# Patient Record
Sex: Female | Born: 1995 | Race: White | Hispanic: No | Marital: Single | Smoking: Never smoker
Health system: Southern US, Community
[De-identification: ages and names within clinical notes are randomized; demographics above are authoritative.]

## PROBLEM LIST (undated history)

## (undated) DIAGNOSIS — Z8719 Personal history of other diseases of the digestive system: Secondary | ICD-10-CM

## (undated) HISTORY — DX: Personal history of other diseases of the digestive system: Z87.19

---
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
## 2016-09-24 ENCOUNTER — Emergency Department
Admission: EM | Admit: 2016-09-24 | Discharge: 2016-09-24 | Disposition: A | Discharge status: Home / Self Care | Payer: BLUE CROSS/BLUE SHIELD | Attending: Student | Admitting: Student

## 2016-09-24 ENCOUNTER — Encounter: Payer: Self-pay | Admitting: Emergency Medicine

## 2016-09-24 DIAGNOSIS — W052XXA Fall from non-moving motorized mobility scooter, initial encounter: Secondary | ICD-10-CM | POA: Insufficient documentation

## 2016-09-24 DIAGNOSIS — IMO0002 Reserved for concepts with insufficient information to code with codable children: Secondary | ICD-10-CM

## 2016-09-24 DIAGNOSIS — Z79899 Other long term (current) drug therapy: Secondary | ICD-10-CM | POA: Insufficient documentation

## 2016-09-24 DIAGNOSIS — S0181XA Laceration without foreign body of other part of head, initial encounter: Secondary | ICD-10-CM | POA: Diagnosis not present

## 2016-09-24 DIAGNOSIS — Y999 Unspecified external cause status: Secondary | ICD-10-CM | POA: Insufficient documentation

## 2016-09-24 DIAGNOSIS — Y9389 Activity, other specified: Secondary | ICD-10-CM | POA: Insufficient documentation

## 2016-09-24 DIAGNOSIS — Y929 Unspecified place or not applicable: Secondary | ICD-10-CM | POA: Diagnosis not present

## 2016-09-24 MED ORDER — LIDOCAINE HCL (PF) 1 % IJ SOLN
INTRAMUSCULAR | Status: AC
  Administered 2016-09-24: 08:00:00
  Filled 2016-09-24: qty 5
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
## 2016-09-24 NOTE — Discharge Instructions (Signed)
Have stitches removed in 7 days. You may return here, go to student health, or go to the [facility].
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
## 2018-04-26 ENCOUNTER — Ambulatory Visit: Payer: BLUE CROSS/BLUE SHIELD | Admitting: Obstetrics and Gynecology

## 2018-04-26 ENCOUNTER — Encounter: Payer: Self-pay | Admitting: Obstetrics and Gynecology

## 2018-04-26 VITALS — BP 108/64 | HR 83 | Ht 64.0 in | Wt 137.0 lb

## 2018-04-26 DIAGNOSIS — N939 Abnormal uterine and vaginal bleeding, unspecified: Secondary | ICD-10-CM

## 2018-04-26 NOTE — Progress Notes (Addendum)
Gynecology Abnormal Uterine Bleeding Initial Evaluation   Chief Complaint:  Chief Complaint  Patient presents with  . Irregular cycles    heavy for few days and then tapers off    History of Present Illness:    Paitient is a 22 y.o. G0P0000 who LMP was Patient's last menstrual period was 04/25/2018 (exact date)., presents today for a problem visit.  She complains of abnormal uterine bleeding that has been present for great than 1 year and its severity is described as moderate.  She has irregular periods every 2-3 weeks and they are associated with mild menstrual cramping.  She has used the following for attempts at control: tampon. The patient is sexually active. She currently uses condomsfor contraception, has used morning after pill as well.   Previous evaluation: none Prior Diagnosis: N/A. Previous Treatment: was written an Rx for LoLoestrin FE in [place] but did not start.  LMP: Patient's last menstrual period was 04/25/2018 (exact date). Shortest Interval: 2 weeks Longest Interval: 3 weeks Duration of flow: 5 days Heavy Menses: yes first two days Intermenstrual Bleeding: no Postcoital Bleeding: no Dysmenorrhea: no  PCOS: denies weight changes in past year, no hirsutism, acne, or periods of amenorrhea  Thyroid: no temperature intolerance, no constipation, no diarrhea, no palpitations, no skin or hair changes  Fragile X: no family history of fragile X or autism, or premature menopause  Prolactin: no headaches, visions changes, or nipple discharge  Review of Systems: Review of Systems  All other systems reviewed and are negative.   Past Medical History:  Past Medical History:  Diagnosis Date  . History of rectal bleeding 2016    Past Surgical History:  Past Surgical History:  Procedure Laterality Date  . COLONOSCOPY  2016    Obstetric History: G0P0000  Family History:  Family History  Problem Relation Age of Onset  . Breast cancer Paternal Grandmother      Social History:  Social History   Socioeconomic History  . Marital status: Single    Spouse name: Not on file  . Number of children: Not on file  . Years of education: Not on file  . Highest education level: Not on file  Occupational History  . Not on file  Social Needs  . Financial resource strain: Not on file  . Food insecurity:    Worry: Not on file    Inability: Not on file  . Transportation needs:    Medical: Not on file    Non-medical: Not on file  Tobacco Use  . Smoking status: Never Smoker  . Smokeless tobacco: Never Used  Substance and Sexual Activity  . Alcohol use: Yes    Comment: 4 x a week  . Drug use: Yes    Types: Cocaine  . Sexual activity: Yes    Partners: Male    Birth control/protection: None  Lifestyle  . Physical activity:    Days per week: 0 days    Minutes per session: 0 min  . Stress: Very much  Relationships  . Social connections:    Talks on phone: Not on file    Gets together: Not on file    Attends religious service: Not on file    Active member of club or organization: Not on file    Attends meetings of clubs or organizations: Not on file    Relationship status: Not on file  . Intimate partner violence:    Fear of current or ex partner: Not on file  Emotionally abused: Not on file    Physically abused: Not on file    Forced sexual activity: Not on file  Other Topics Concern  . 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  Patient's last menstrual period was 04/25/2018 (exact date).  General: NAD HEENT: normocephalic, anicteric Pulmonary: No increased work of breathing Genitourinary:  External: Normal external female genitalia.  Normal  urethral meatus, normal Bartholin's and Skene's glands.    Vagina: Normal vaginal mucosa, no evidence of prolapse.    Cervix: Grossly normal in appearance, mild bleeding present  Uterus: Non-enlarged, mobile, normal contour.  No CMT  Adnexa: ovaries non-enlarged, no adnexal masses  Rectal: deferred  Lymphatic: no evidence of inguinal lymphadenopathy Extremities: no edema, erythema, or tenderness Neurologic: Grossly intact Psychiatric: mood appropriate, affect full  Female chaperone present for pelvic portions of the physical exam  Assessment: 22 y.o. G0P0000 with abnormal uterine bleeding  Plan: Problem List Items Addressed This Visit    None    Visit Diagnoses    Abnormal uterine bleeding    -  Primary   Relevant Orders   TSH+Prl+FSH+TestT+LH+DHEA S...   US Transvaginal Non-OB      1) Discussed management options for abnormal uterine bleeding including expectant, NSAIDs, tranexamic acid (Lysteda), oral progesterone (Provera, norethindrone, megace), Depo Provera, Levonorgestrel containing IUD, endometrial ablation (Novasure) or hysterectomy as definitive surgical management.  Discussed risks and benefits of each method.   Final management decision will hinge on results of patient's work up and whether an underlying etiology for the patients bleeding symptoms can be discerned.  We will conduct a basic work up examining using the PALM-COIEN classification system.  In the meantime the patient opts to trial expectant management while we await results of her ultrasound and labs.  The patient has some concern that this may affect future fertility - will obtain day 3 FSH/Estradiol with next cycle for ovarian reserve testing  2) STI screening - offered and declined  3) Gardasil series - discussed and given handouts  4)  Return in about 1 week (around 05/03/2018) for TVUS and gyn follow up.   Vena Austria, MD, [facility], [facility] 04/26/2018, 9:27  AM

## 2018-04-26 NOTE — Patient Instructions (Signed)
[email or web address] 

## 2018-04-29 LAB — TSH+PRL+FSH+TESTT+LH+DHEA S...
17 HYDROXYPROGESTERONE: 58 ng/dL
Androstenedione: 273 ng/dL — ABNORMAL HIGH (ref 41–262)
DHEA-SO4: 610.2 ug/dL — ABNORMAL HIGH (ref 110.0–431.7)
FSH: 5 m[IU]/mL
LH: 9.4 m[IU]/mL
PROLACTIN: 27.9 ng/mL — AB (ref 4.8–23.3)
TESTOSTERONE FREE: 10 pg/mL — AB (ref 0.0–4.2)
TESTOSTERONE: 108 ng/dL — AB (ref 8–48)
TSH: 1.6 u[IU]/mL (ref 0.450–4.500)

## 2018-05-01 ENCOUNTER — Encounter (INDEPENDENT_AMBULATORY_CARE_PROVIDER_SITE_OTHER): Payer: Self-pay

## 2018-05-01 ENCOUNTER — Telehealth: Payer: Self-pay | Admitting: Obstetrics and Gynecology

## 2018-05-01 NOTE — Telephone Encounter (Signed)
Patient is calling for labs results. Please advise. 

## 2018-05-02 ENCOUNTER — Other Ambulatory Visit: Payer: Self-pay | Admitting: Obstetrics and Gynecology

## 2018-05-02 ENCOUNTER — Encounter (INDEPENDENT_AMBULATORY_CARE_PROVIDER_SITE_OTHER): Payer: Self-pay

## 2018-05-02 DIAGNOSIS — R7989 Other specified abnormal findings of blood chemistry: Secondary | ICD-10-CM

## 2018-05-02 NOTE — Telephone Encounter (Signed)
Patient is following up on test results. Please advise

## 2018-05-03 ENCOUNTER — Telehealth: Payer: Self-pay | Admitting: Obstetrics and Gynecology

## 2018-05-03 NOTE — Telephone Encounter (Signed)
Patient returned the call and is aware of appointment, location, and instructions. Patient was given the address and directions to [facility], Patient was given directions to [facility] to pick up the prep kit. Patient is aware of water only after 6am on day of test, and that she will receive instructions with the prep kit and to look over those and ask any questions at that time. Patient will ask if okay for alcohol on Thursday night.

## 2018-05-03 NOTE — Telephone Encounter (Signed)
Patient is scheduled at first available appointment at [facility] on Friday, 05/05/18 @ 10am. Patient should arrive @ 9:45am and can have water only after 6am. Patient should pick up a prep kit by Thursday, 8am-4pm, either facility. Lmtrc.

## 2018-05-04 ENCOUNTER — Other Ambulatory Visit: Payer: Self-pay | Admitting: Obstetrics and Gynecology

## 2018-05-04 DIAGNOSIS — E281 Androgen excess: Secondary | ICD-10-CM

## 2018-05-05 ENCOUNTER — Ambulatory Visit
Admission: RE | Admit: 2018-05-05 | Discharge: 2018-05-05 | Disposition: A | Discharge status: Home / Self Care | Payer: BLUE CROSS/BLUE SHIELD | Source: Ambulatory Visit | Attending: Obstetrics and Gynecology | Admitting: Obstetrics and Gynecology

## 2018-05-05 ENCOUNTER — Encounter (INDEPENDENT_AMBULATORY_CARE_PROVIDER_SITE_OTHER): Payer: Self-pay

## 2018-05-05 DIAGNOSIS — E281 Androgen excess: Secondary | ICD-10-CM | POA: Insufficient documentation

## 2018-05-10 ENCOUNTER — Ambulatory Visit (INDEPENDENT_AMBULATORY_CARE_PROVIDER_SITE_OTHER): Payer: BLUE CROSS/BLUE SHIELD

## 2018-05-10 ENCOUNTER — Encounter: Payer: Self-pay | Admitting: Obstetrics and Gynecology

## 2018-05-10 ENCOUNTER — Ambulatory Visit (INDEPENDENT_AMBULATORY_CARE_PROVIDER_SITE_OTHER): Payer: BLUE CROSS/BLUE SHIELD | Admitting: Obstetrics and Gynecology

## 2018-05-10 VITALS — BP 112/58 | HR 96 | Ht 64.0 in | Wt 136.0 lb

## 2018-05-10 DIAGNOSIS — N939 Abnormal uterine and vaginal bleeding, unspecified: Secondary | ICD-10-CM

## 2018-05-10 DIAGNOSIS — E281 Androgen excess: Secondary | ICD-10-CM

## 2018-05-11 ENCOUNTER — Encounter: Payer: Self-pay | Admitting: Obstetrics and Gynecology

## 2018-05-11 NOTE — Progress Notes (Signed)
Gynecology Ultrasound Follow Up  Chief Complaint:  Chief Complaint  Patient presents with  . U/S follow up     History of Present Illness: Patient is a 22 y.o. female who presents today for ultrasound evaluation of AUB and excess androgens.  Ultrasound demonstrates the following findgins Adnexa: Normal consistency bilaterally although the right ovarian volume is increased at 15cm^3 Uterus: unremarkable with thin endometrials stripe no myometrial defects Additional: none  Review of Systems: Review of Systems  Constitutional: Negative.   Gastrointestinal: Negative.   Skin: Negative.     Past Medical History:  Past Medical History:  Diagnosis Date  . History of rectal bleeding 2016    Past Surgical History:  Past Surgical History:  Procedure Laterality Date  . COLONOSCOPY  2016    Gynecologic History:  Patient's last menstrual period was 04/25/2018 (exact date). Contraception: none Last Pap: N/A <21  Family History:  Family History  Problem Relation Age of Onset  . Breast cancer Paternal Grandmother     Social History:  Social History   Socioeconomic History  . Marital status: Single    Spouse name: Not on file  . Number of children: Not on file  . Years of education: Not on file  . Highest education level: Not on file  Occupational History  . Not on file  Social Needs  . Financial resource strain: Not on file  . Food insecurity:    Worry: Not on file    Inability: Not on file  . Transportation needs:    Medical: Not on file    Non-medical: Not on file  Tobacco Use  . Smoking status: Never Smoker  . Smokeless tobacco: Never Used  Substance and Sexual Activity  . Alcohol use: Yes    Comment: 4 x a week  . Drug use: Yes    Types: Cocaine  . Sexual activity: Yes    Partners: Male    Birth control/protection: None  Lifestyle  . Physical activity:    Days per week: 0 days    Minutes per session: 0 min  . Stress: Very much  Relationships  .  Social connections:    Talks on phone: Not on file    Gets together: Not on file    Attends religious service: Not on file    Active member of club or organization: Not on file    Attends meetings of clubs or organizations: Not on file    Relationship status: Not on file  . Intimate partner violence:    Fear of current or ex partner: Not on file    Emotionally abused: Not on file    Physically abused: Not on file    Forced sexual activity: Not on file  Other Topics Concern  . 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  General: NAD HEENT: normocephalic, anicteric Pulmonary: No increased work of breathing Extremities: no edema, erythema, or tenderness Neurologic: Grossly intact, normal gait Psychiatric: mood appropriate, affect full  Ct Abdomen Wo Contrast  Result Date: 05/05/2018 CLINICAL DATA:  Elevated androgen levels. EXAM: CT ABDOMEN WITHOUT CONTRAST TECHNIQUE: Multidetector CT imaging of the abdomen was performed following the standard protocol without IV  contrast. COMPARISON:  None. FINDINGS: Lower chest: No acute abnormality. Hepatobiliary: No focal liver abnormality is seen. No gallstones, gallbladder wall thickening, or biliary dilatation. Pancreas: Unremarkable. No pancreatic ductal dilatation or surrounding inflammatory changes. Spleen: Normal in size without focal abnormality. Adrenals/Urinary Tract: Adrenal glands and kidneys appear normal. No hydronephrosis or renal obstruction is noted. No renal calculi are noted. Stomach/Bowel: Stomach and visualized bowel loops are unremarkable. Vascular/Lymphatic: No significant vascular abnormality is noted. No adenopathy is noted. Other: No  abdominal wall hernia or abnormality. Musculoskeletal: No acute or significant osseous findings. IMPRESSION: No abnormality seen in the abdomen.  Adrenal glands appear normal. Electronically Signed   By: Lupita Raider, M.D.   On: 05/05/2018 14:50   US Transvaginal Non-ob  Result Date: 05/10/2018 ULTRASOUND REPORT Patient Name: Briana Pierce DOB: Jan 14, 1996 MRN: [id number] Location: [facility] Date of Service: 05/10/2018 Indications:AUB Findings: The uterus is anteverted and measures 7.96 x 4.44 x 3.48cm. Echo texture is homogenous without evidence of focal masses. The Endometrium measures 6.33 mm. Right Ovary measures 4.03 x 3.43 x 2.20 cm, 15.9cc's. It is normal in appearance. Left Ovary measures 2.68 x 2.03 x 2.09 cm, 5.9cc's. It is normal in appearance. Survey of the adnexa demonstrates no adnexal masses. There is no free fluid in the cul de sac. Impression: 1. Normal gyn ultrasound Recommendations: 1.Clinical correlation with the patient's History and Physical Exam. Willette Alma, RDMS, RVT Images reviewed.  Normal GYN study without visualized pathology.  Vena Austria, MD, [facility], [facility] 05/10/2018, 2:35 PM     Assessment: 21 y.o. G0P0000 No problem-specific Assessment & Plan notes found for this encounter.   Plan: Problem List Items Addressed This Visit    None    Visit Diagnoses    Elevated androgen levels    -  Primary   Relevant Orders   AFP tumor marker   Inhibin B   Testosterone,Free and Total   DHEA-sulfate   Androstenedione      1) Ultrasound evaluation - the right ovary is relatively enlarged in overall volume compared to the left, there is NO distinct mass visible.  Discussed with patient that given normal adrenal glands on CT, I had been most concerned about a Sertoli-Leydig cell tumor of the ovary.  These represent as large ovarian masses.  Given the elevated androgen levels were found incidentally finding of andorgen elevation  during work up for irregular cycles tumor markers were obtained today to rule out an early or small sex cord stromal tumor.  I will discuss the case with gynecology oncology to get there input on any further work up or observation.  2) A total of 15 minutes were spent in face-to-face contact with the patient during this encounter with over half of that time devoted to counseling and coordination of care.    Vena Austria, MD, [facility], [facility] 05/11/2018, 2:37 PM

## 2018-05-15 ENCOUNTER — Telehealth: Payer: Self-pay

## 2018-05-15 NOTE — Telephone Encounter (Signed)
The patient has no one her Hippa form. Please call patient with results

## 2018-05-15 NOTE — Telephone Encounter (Signed)
Pt mom calling for results for daughter, states if daughter needs to call or give permission for her to have results than she will. Please call one of them with results

## 2018-05-16 ENCOUNTER — Encounter: Payer: Self-pay | Admitting: Obstetrics and Gynecology

## 2018-05-16 NOTE — Telephone Encounter (Signed)
Not back yet I will be calling her once they are back

## 2018-05-17 LAB — AFP TUMOR MARKER: AFP, SERUM, TUMOR MARKER: 3.1 ng/mL (ref 0.0–8.3)

## 2018-05-17 LAB — ANDROSTENEDIONE: Androstenedione: 224 ng/dL (ref 41–262)

## 2018-05-17 LAB — DHEA-SULFATE: DHEA-SO4: 659.6 ug/dL — ABNORMAL HIGH (ref 110.0–431.7)

## 2018-05-17 LAB — INHIBIN B: Inhibin B: 251.1 pg/mL

## 2018-05-17 LAB — TESTOSTERONE,FREE AND TOTAL
TESTOSTERONE FREE: 8.7 pg/mL — AB (ref 0.0–4.2)
Testosterone: 83 ng/dL — ABNORMAL HIGH (ref 8–48)

## 2018-05-18 ENCOUNTER — Encounter (INDEPENDENT_AMBULATORY_CARE_PROVIDER_SITE_OTHER): Payer: Self-pay

## 2018-05-18 ENCOUNTER — Other Ambulatory Visit: Payer: Self-pay | Admitting: Obstetrics and Gynecology

## 2018-06-20 ENCOUNTER — Encounter: Payer: Self-pay | Admitting: Obstetrics and Gynecology

## 2018-06-20 ENCOUNTER — Other Ambulatory Visit: Payer: Self-pay | Admitting: Obstetrics and Gynecology

## 2018-06-20 DIAGNOSIS — E282 Polycystic ovarian syndrome: Secondary | ICD-10-CM

## 2018-06-20 DIAGNOSIS — E281 Androgen excess: Secondary | ICD-10-CM

## 2018-07-05 ENCOUNTER — Encounter: Payer: Self-pay | Admitting: Obstetrics and Gynecology

## 2018-07-13 ENCOUNTER — Telehealth: Payer: Self-pay

## 2018-07-13 NOTE — Telephone Encounter (Addendum)
Linda w/[facility] in [place] reports they received an encounter summary from us that lists all the labs pt had but are listed in the enounter summary. Their NP is having trouble reading them that way & requests we send them the labs as they appear in the original [facility] [employer] [employer] summary. Fax [phone]. Cb#[phone] Ext [phone]. Harvel QualeJanet Budzynski, N.P. requesting.

## 2018-07-13 NOTE — Telephone Encounter (Signed)
Lab Results received from [facility] Tech in office & Faxed as requested.

## 2018-09-06 ENCOUNTER — Encounter: Payer: Self-pay | Admitting: Obstetrics and Gynecology

## 2018-09-06 ENCOUNTER — Ambulatory Visit (INDEPENDENT_AMBULATORY_CARE_PROVIDER_SITE_OTHER): Payer: BLUE CROSS/BLUE SHIELD | Admitting: Obstetrics and Gynecology

## 2018-09-06 VITALS — BP 114/74 | HR 97 | Ht 64.0 in | Wt 139.0 lb

## 2018-09-06 DIAGNOSIS — E282 Polycystic ovarian syndrome: Secondary | ICD-10-CM | POA: Diagnosis not present

## 2018-09-06 DIAGNOSIS — L680 Hirsutism: Secondary | ICD-10-CM | POA: Diagnosis not present

## 2018-09-06 DIAGNOSIS — L709 Acne, unspecified: Secondary | ICD-10-CM | POA: Diagnosis not present

## 2018-09-06 MED ORDER — NORETHIN ACE-ETH ESTRAD-FE 1-20 MG-MCG(24) PO CAPS: 1.0000 | ORAL_CAPSULE | Freq: Every day | ORAL | 0 refills | Status: DC

## 2018-09-06 MED ORDER — SPIRONOLACTONE 100 MG PO TABS: 100.0000 mg | ORAL_TABLET | Freq: Every day | ORAL | 11 refills | Status: DC

## 2018-09-06 NOTE — Progress Notes (Addendum)
Obstetrics & Gynecology Office Visit   Chief Complaint:  Chief Complaint  Patient presents with  . Follow-up    PCOS/ Irregular cycles    History of Present Illness: 22 y.o. G0P0000 presenting for medication follow up and discussion of additional management option for a diagnosis of PCOS.  She is currently being managed with lo loestrin FE.   The patient reports no improvement in symptoms.  On her current medication regimen has intermittent breakthrough bleeding.   She has not noted any side-effects or new symptoms.  She discontinued lo loestrin secondary to failure to achieve withdrawal bleeds consistently.  She also is interested in additional options to manage hirsutism and acne.  She is currently awaiting consultation with a REI.  Is interested in repeat ultrasound to evaluate ovaries again.  She also expresses concern about future fertility and whether she should undergo oocyte freezing.  We discussed that this isn't indicated, that some patient achieve conception with out additional measures despite PCOS, and that response to oral ovulation agents such as letrozole is generally good.  Review of Systems: Review of Systems  Constitutional: Negative.   Gastrointestinal: Negative.   Genitourinary: Negative.    Past Medical History:  Past Medical History:  Diagnosis Date  . History of rectal bleeding 2016    Past Surgical History:  Past Surgical History:  Procedure Laterality Date  . COLONOSCOPY  2016    Gynecologic History: Patient's last menstrual period was 08/26/2018 (exact date).  Obstetric History: G0P0000  Family History:  Family History  Problem Relation Age of Onset  . Breast cancer Paternal Grandmother     Social History:  Social History   Socioeconomic History  . Marital status: Single    Spouse name: Not on file  . Number of children: Not on file  . Years of education: Not on file  . Highest education level: Not on file  Occupational History  .  Not on file  Social Needs  . Financial resource strain: Not on file  . Food insecurity:    Worry: Not on file    Inability: Not on file  . Transportation needs:    Medical: Not on file    Non-medical: Not on file  Tobacco Use  . Smoking status: Never Smoker  . Smokeless tobacco: Never Used  Substance and Sexual Activity  . Alcohol use: Yes    Comment: 4 x a week  . Drug use: Yes    Types: Cocaine  . Sexual activity: Yes    Partners: Male    Birth control/protection: None  Lifestyle  . Physical activity:    Days per week: 0 days    Minutes per session: 0 min  . Stress: Very much  Relationships  . Social connections:    Talks on phone: Not on file    Gets together: Not on file    Attends religious service: Not on file    Active member of club or organization: Not on file    Attends meetings of clubs or organizations: Not on file    Relationship status: Not on file  . Intimate partner violence:    Fear of current or ex partner: Not on file    Emotionally abused: Not on file    Physically abused: Not on file    Forced sexual activity: Not on file  Other Topics Concern  . 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 Authorizing Provider  Norethin Ace-Eth Estrad-FE (TAYTULLA) 1-20 MG-MCG(24) CAPS Take 1 tablet by mouth daily. 09/06/18   Vena Austria, MD  spironolactone (ALDACTONE) 100 MG tablet Take 1 tablet (100 mg total) by mouth daily. 09/06/18   Vena Austria, MD    Physical Exam Vitals:  Vitals:   09/06/18 0854  BP: 114/74  Pulse: 97   Patient's last menstrual period was 08/26/2018 (exact date).  General: NAD HEENT: normocephalic, anicteric Pulmonary: No increased work of breathing Neurologic: Grossly intact Psychiatric: mood appropriate, affect full  Female chaperone  present for pelvic  portions of the physical exam  Assessment: 22 y.o. G0P0000 with PCOS, surveillance of oral contraceptive  Plan: Problem List Items Addressed This Visit    None    Visit Diagnoses    PCOS (polycystic ovarian syndrome)    -  Primary   Relevant Orders   US Transvaginal Non-OB   Hirsutism       Acne, unspecified acne type       Relevant Medications   Norethin Ace-Eth Estrad-FE (TAYTULLA) 1-20 MG-MCG(24) CAPS     1) PCOS - will obtain repeat ultrasound.  Given the level of androgen elevation we initially obtained an ultrasound to rule out sex-cord stromal tumor.   - switch to OCP   2) Hirsutism - start on spironolactone.  Finasteride, flutamide, and Vaniqa discussed as alternatives.     3) Healthcare maintenance - do for pap smear  4) A total of 25 minutes were spent in face-to-face contact with the patient during this encounter with over half of that time devoted to counseling and coordination of care.  5) Return in about 1 week (around 09/13/2018) for gyn ultrasound PCOS.    Vena Austria, MD, [facility], [facility] 09/06/2018, 11:46 AM

## 2018-09-06 NOTE — Addendum Note (Signed)
Addended by: Lorrene Reid on: 09/06/2018 11:55 AM   Modules accepted: Level of Service

## 2018-09-08 ENCOUNTER — Ambulatory Visit: Payer: BLUE CROSS/BLUE SHIELD | Admitting: Obstetrics and Gynecology

## 2018-09-18 ENCOUNTER — Ambulatory Visit (INDEPENDENT_AMBULATORY_CARE_PROVIDER_SITE_OTHER): Payer: BLUE CROSS/BLUE SHIELD | Admitting: Obstetrics and Gynecology

## 2018-09-18 ENCOUNTER — Ambulatory Visit (INDEPENDENT_AMBULATORY_CARE_PROVIDER_SITE_OTHER): Payer: BLUE CROSS/BLUE SHIELD

## 2018-09-18 ENCOUNTER — Encounter: Payer: Self-pay | Admitting: Obstetrics and Gynecology

## 2018-09-18 VITALS — BP 100/58 | HR 89 | Ht 64.0 in | Wt 140.0 lb

## 2018-09-18 DIAGNOSIS — E282 Polycystic ovarian syndrome: Secondary | ICD-10-CM

## 2018-09-18 NOTE — Progress Notes (Signed)
Gynecology Ultrasound Follow Up  Chief Complaint:  Chief Complaint  Patient presents with  . Follow-up    GYN U/S PCOS     History of Present Illness: Patient is a 22 y.o. female who presents today for ultrasound evaluation of PCOS.  Ultrasound demonstrates the following findgins Adnexa: Increased ovarian volume and string of pearls noted Uterus: Non-enlarged with normally imaged endometrial stripe   Additional: No free fluid  Review of Systems: Review of Systems  Constitutional: Negative.   Skin: Negative.   Endo/Heme/Allergies: Negative.     Past Medical History:  Past Medical History:  Diagnosis Date  . History of rectal bleeding 2016    Past Surgical History:  Past Surgical History:  Procedure Laterality Date  . COLONOSCOPY  2016    Gynecologic History:  Patient's last menstrual period was 08/26/2018 (exact date).  Family History:  Family History  Problem Relation Age of Onset  . Breast cancer Paternal Grandmother     Social History:  Social History   Socioeconomic History  . Marital status: Single    Spouse name: Not on file  . Number of children: Not on file  . Years of education: Not on file  . Highest education level: Not on file  Occupational History  . Not on file  Social Needs  . Financial resource strain: Not on file  . Food insecurity:    Worry: Not on file    Inability: Not on file  . Transportation needs:    Medical: Not on file    Non-medical: Not on file  Tobacco Use  . Smoking status: Never Smoker  . Smokeless tobacco: Never Used  Substance and Sexual Activity  . Alcohol use: Yes    Comment: 4 x a week  . Drug use: Yes    Types: Cocaine  . Sexual activity: Yes    Partners: Male    Birth control/protection: None  Lifestyle  . Physical activity:    Days per week: 0 days    Minutes per session: 0 min  . Stress: Very much  Relationships  . Social connections:    Talks on phone: Not on file    Gets together: Not on  file    Attends religious service: Not on file    Active member of club or organization: Not on file    Attends meetings of clubs or organizations: Not on file    Relationship status: Not on file  . Intimate partner violence:    Fear of current or ex partner: Not on file    Emotionally abused: Not on file    Physically abused: Not on file    Forced sexual activity: Not on file  Other Topics Concern  . 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 Authorizing Provider  Norethin Ace-Eth Estrad-FE (TAYTULLA) 1-20 MG-MCG(24) CAPS Take 1 tablet by mouth daily. 09/06/18   Vena AustriaStaebler, Gesselle Fitzsimons, MD  spironolactone (ALDACTONE) 100 MG tablet Take 1 tablet (100 mg total) by mouth daily. 09/06/18   Vena AustriaStaebler, Cloee Dunwoody, MD    Physical Exam Vitals: Blood pressure (!) 100/58, pulse 89, height 5\' 4"  (1.626 m), weight 140 lb (63.5 kg), last menstrual period 08/26/2018.  General: NAD HEENT: normocephalic, anicteric Pulmonary: No increased work of breathing Extremities: no edema, erythema, or tenderness Neurologic: Grossly intact, normal gait  Psychiatric: mood appropriate, affect full  US Transvaginal Non-ob  Result Date: 09/18/2018 Patient Name: Briana Pierce DOB: [birth year] MRN: [id number] ULTRASOUND REPORT Location: [facility] Date of Service: 09/18/2018 Indications:PCOS Findings: The uterus is anteverted and measures 6.92x4.14x3.11cm. Echo texture is homogenous without evidence of focal masses. The Endometrium measures 4.4 mm. Right Ovary measures is not normal in appearance. Evidences of PCOS are seen Left Ovary measures 2.68x3.18x1.72 cm. It is not normal in appearance.Evidences of PCOS are seen Survey of the adnexa demonstrates no adnexal masses. There is no free fluid in the cul de sac. Impression: 1.Evidences of PCOS are  seen in both Rt and Lt ovaries Recommendations: 1.Clinical correlation with the patient's History and Physical Exam. Augustine Radar, RDMS Images reviewed.  Uterus images normally on today's study.  Both ovaries are consistent with PCOS by Rotter[place] criteria with >12 follicles noted and ovarian volume greater than 10cm^3.  Vena Austria, MD, [facility], [facility] 09/18/2018, 9:13 PM     Assessment: 22 y.o. G0P0000 No problem-specific Assessment & Plan notes found for this encounter.   Plan: Problem List Items Addressed This Visit    None    Visit Diagnoses    PCOS (polycystic ovarian syndrome)    -  Primary      1) Pap and annual in the next 2 months  2) PCOS - ultrasound with polycystic appearing ovaries today but otherwise negative.  3) Continue spironolactone for hirsutism  4) A total of 15 minutes were spent in face-to-face contact with the patient during this encounter with over half of that time devoted to counseling and coordination of care.  5) Return in about 2 months (around 11/18/2018) for annual.    Vena Austria, MD, [facility], [facility] 09/18/2018, 3:47 PM

## 2018-11-28 ENCOUNTER — Other Ambulatory Visit: Payer: Self-pay | Admitting: Obstetrics and Gynecology

## 2018-11-28 MED ORDER — NORETHIN ACE-ETH ESTRAD-FE 1-20 MG-MCG(24) PO CAPS: 1.0000 | ORAL_CAPSULE | Freq: Every day | ORAL | 3 refills | Status: DC

## 2018-12-05 ENCOUNTER — Other Ambulatory Visit: Payer: Self-pay

## 2018-12-05 MED ORDER — SPIRONOLACTONE 100 MG PO TABS: 100.0000 mg | ORAL_TABLET | Freq: Every day | ORAL | 11 refills | Status: AC

## 2018-12-11 ENCOUNTER — Other Ambulatory Visit: Payer: Self-pay

## 2018-12-11 MED ORDER — NORETHIN ACE-ETH ESTRAD-FE 1-20 MG-MCG(24) PO CAPS: 1.0000 | ORAL_CAPSULE | Freq: Every day | ORAL | 3 refills | Status: DC

## 2018-12-21 ENCOUNTER — Telehealth: Payer: Self-pay | Admitting: Obstetrics and Gynecology

## 2018-12-21 NOTE — Telephone Encounter (Signed)
Patient is requesting a refill on birthcontrol. Please advise

## 2018-12-22 ENCOUNTER — Other Ambulatory Visit: Payer: Self-pay | Admitting: Obstetrics and Gynecology

## 2018-12-22 MED ORDER — NORETHIN ACE-ETH ESTRAD-FE 1-20 MG-MCG(24) PO CAPS: 1.0000 | ORAL_CAPSULE | Freq: Every day | ORAL | 3 refills | Status: AC

## 2019-01-10 IMAGING — CT CT ABDOMEN W/O CM
1 of 2 series · 15 of 32 positions shown, 19 images · non-contrast
Comparison: None.

CLINICAL DATA: Elevated androgen levels.

EXAM:
CT ABDOMEN WITHOUT CONTRAST
TECHNIQUE: Multidetector CT imaging of the abdomen was performed following the
standard protocol without IV contrast.

[Series 2: axial st · axial · 0.68mm/px · z∈[-750,-524]mm · 15 of 51 slices shown, 19 images]
[im 3/51  soft-tissue]
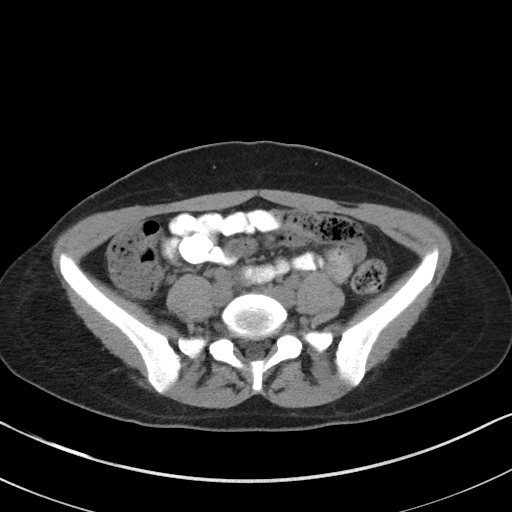
[im 3/51  bone]
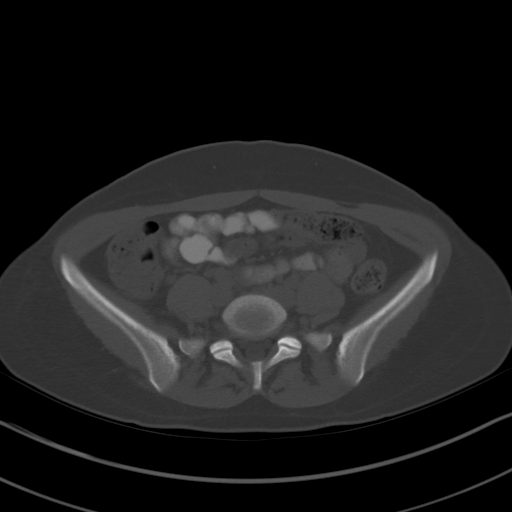
[im 7/51  soft-tissue]
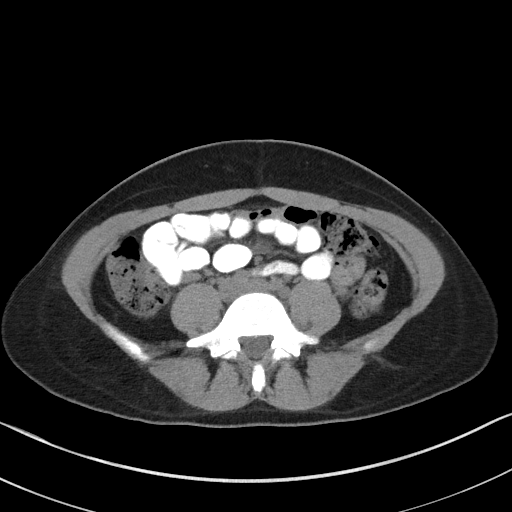
[im 11/51  soft-tissue]
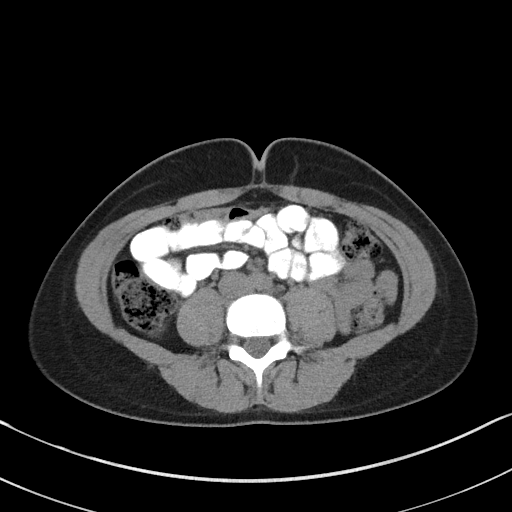
[im 15/51  soft-tissue]
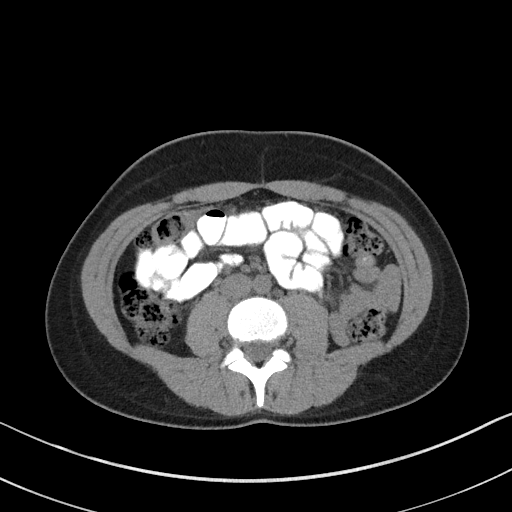
[im 17/51  soft-tissue]
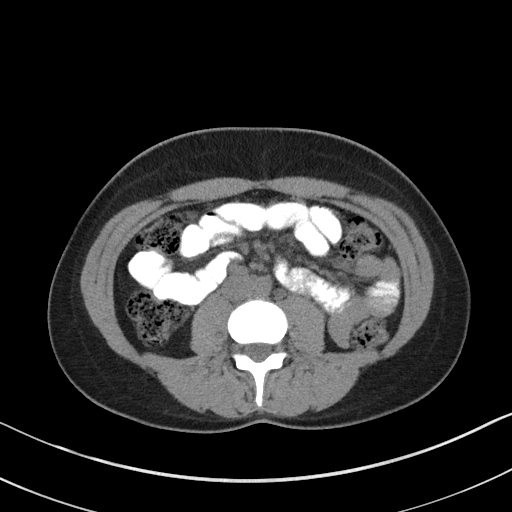
[im 21/51  soft-tissue]
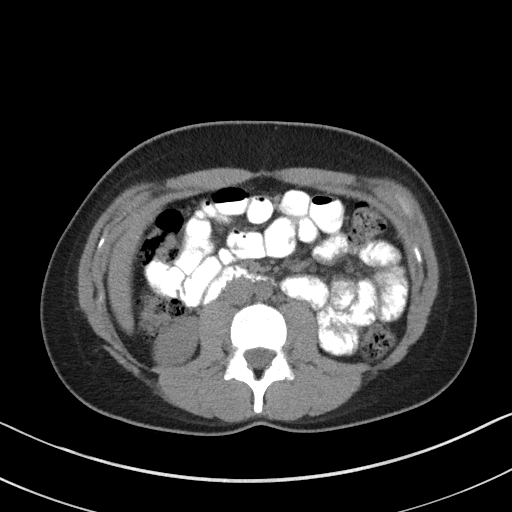
[im 26/51  soft-tissue]
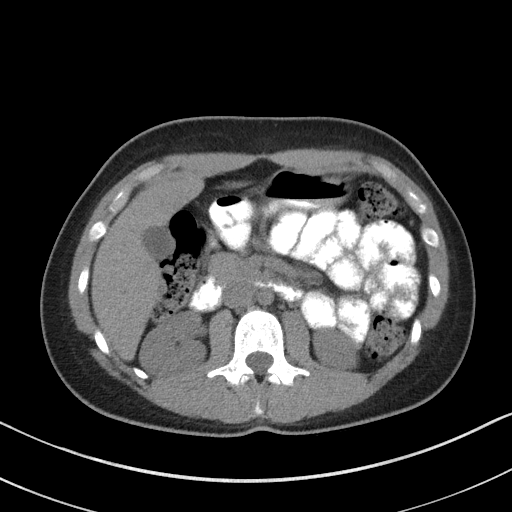
[im 30/51  soft-tissue]
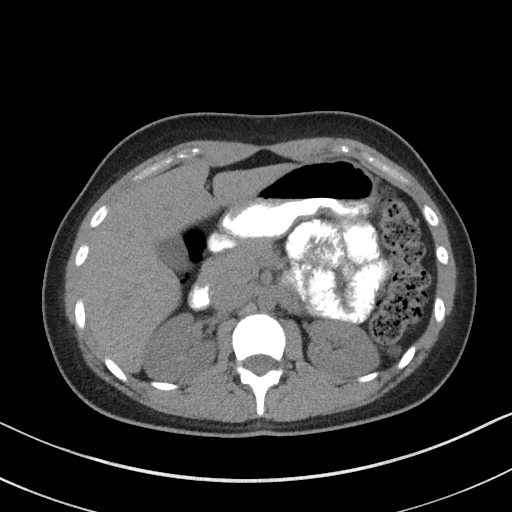
[im 34/51  soft-tissue]
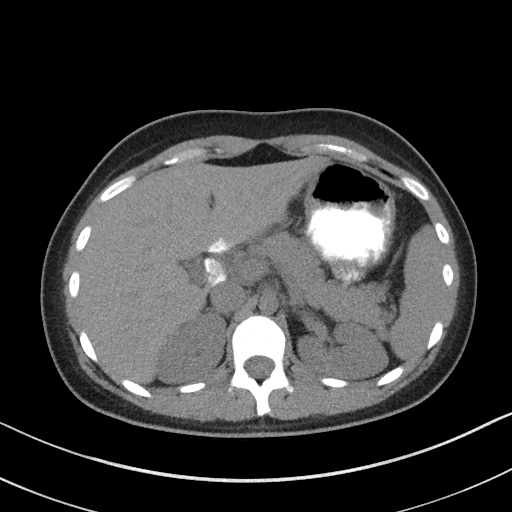
[im 34/51  bone]
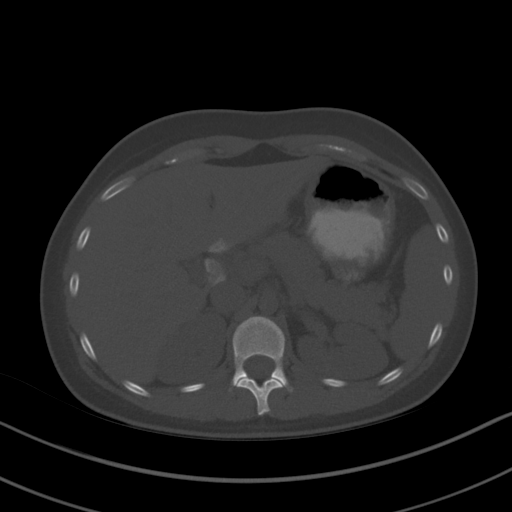
[im 36/51  soft-tissue]
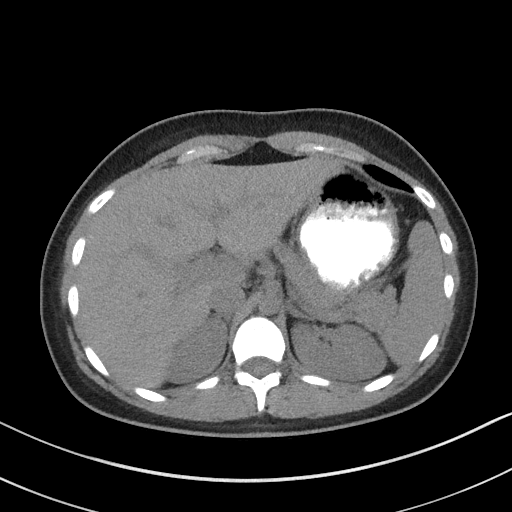
[im 40/51  soft-tissue]
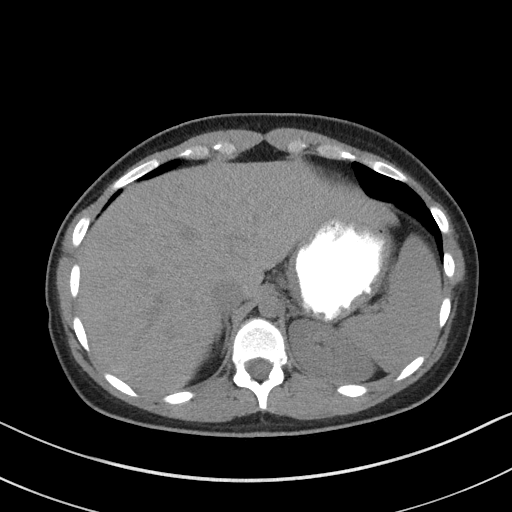
[im 42/51  lung]
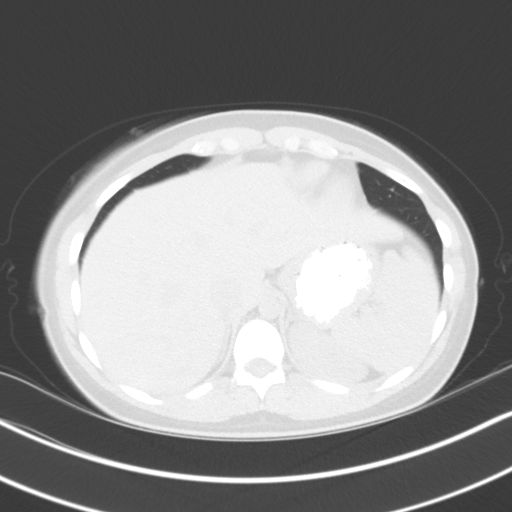
[im 44/51  soft-tissue]
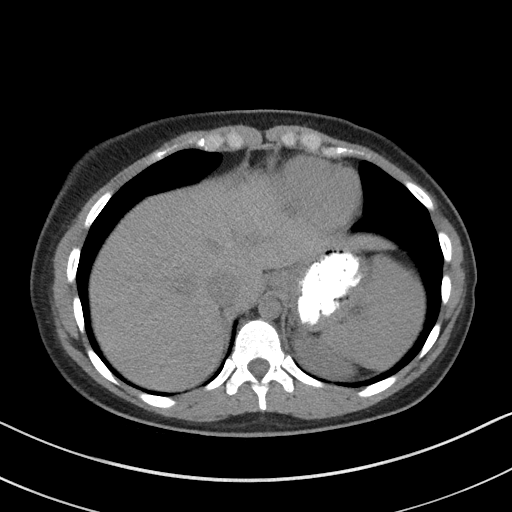
[im 44/51  lung]
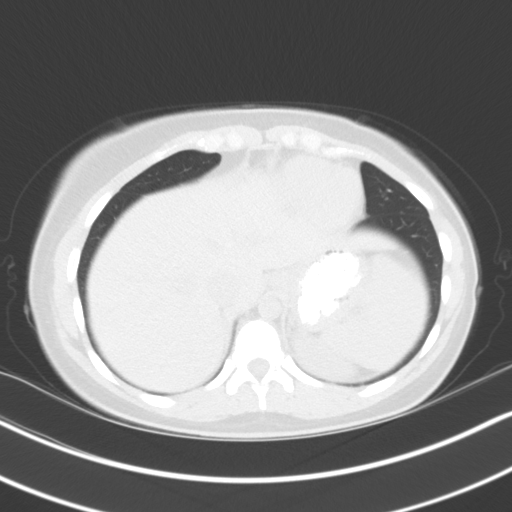
[im 46/51  lung]
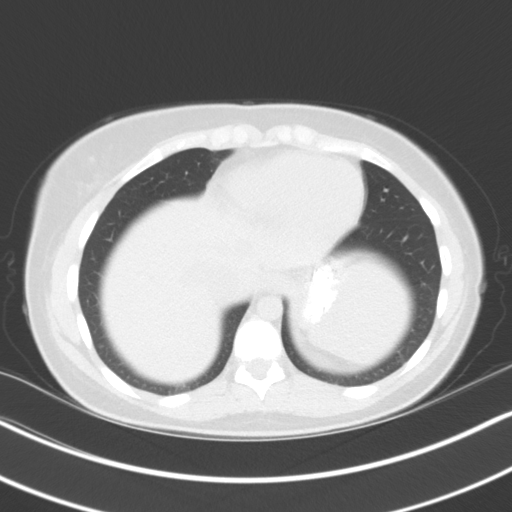
[im 48/51  soft-tissue]
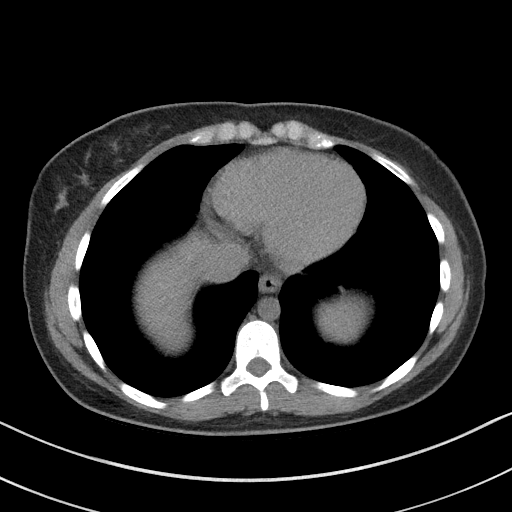
[im 48/51  lung]
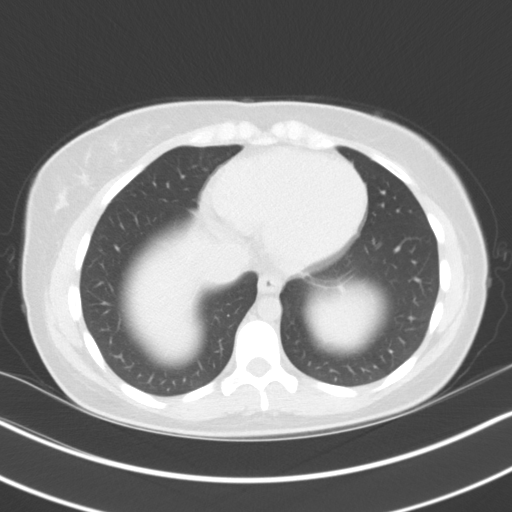

[15 of 32 positions shown; findings below may reference images not displayed]

FINDINGS: Lower chest: No acute abnormality.

Hepatobiliary: No focal liver abnormality is seen. No gallstones,
gallbladder wall thickening, or biliary dilatation.

Pancreas: Unremarkable. No pancreatic ductal dilatation or
surrounding inflammatory changes.

Spleen: Normal in size without focal abnormality.

Adrenals/Urinary Tract: Adrenal glands and kidneys appear normal. No
hydronephrosis or renal obstruction is noted. No renal calculi are
noted.

Stomach/Bowel: Stomach and visualized bowel loops are unremarkable.

Vascular/Lymphatic: No significant vascular abnormality is noted. No
adenopathy is noted.

Other: No abdominal wall hernia or abnormality.

Musculoskeletal: No acute or significant osseous findings.
IMPRESSION: No abnormality seen in the abdomen.  Adrenal glands appear normal.

## 2019-01-30 ENCOUNTER — Telehealth: Payer: Self-pay

## 2019-01-30 NOTE — Telephone Encounter (Signed)
Pt would like a paper rx for her bc.  She will p/u.  She's been without bc for 36m.  [facility] isn't getting it electronically.  [phone]

## 2019-01-30 NOTE — Telephone Encounter (Signed)
Please let patient know I verbally called it in to pharmacy

## 2019-01-30 NOTE — Telephone Encounter (Signed)
Pt aware.

## 2019-05-28 ENCOUNTER — Other Ambulatory Visit: Payer: Self-pay | Admitting: Obstetrics and Gynecology

## 2019-05-28 DIAGNOSIS — E282 Polycystic ovarian syndrome: Secondary | ICD-10-CM

## 2019-06-18 ENCOUNTER — Telehealth: Payer: Self-pay

## 2019-06-18 NOTE — Telephone Encounter (Signed)
From a liability standpoint they would have to order because I need to make sure she has follow up in place.  So I would advise her to have her first visit then have them order any imaging

## 2019-06-18 NOTE — Telephone Encounter (Signed)
Can you let pt know this?

## 2019-06-18 NOTE — Telephone Encounter (Signed)
Pt calling states she is a former pt; graduated from [place]; needs order for u/s for PCOS Sent to [place], [place].  Their # is [phone].  Pt's # [phone]

## 2019-06-18 NOTE — Telephone Encounter (Signed)
Please advise 

## 2019-06-18 NOTE — Telephone Encounter (Signed)
They told her to have [place] send the order.  They have not seen her yet.

## 2019-06-18 NOTE — Telephone Encounter (Signed)
Generally if she is going to be following up with her OB in [place], [place] they would place the order

## 2019-06-19 NOTE — Telephone Encounter (Signed)
Pt aware.

## 2019-11-05 ENCOUNTER — Telehealth: Payer: Self-pay

## 2019-11-05 NOTE — Telephone Encounter (Signed)
advise

## 2019-11-05 NOTE — Telephone Encounter (Signed)
Pt calling; has graduated from [place] and has moved to [place].  Wants to know if [facility] could refer her to an Endocrinologist in [place].  [phone]  Called pt to adv the first step would be for her to ck c her ins to see who is in her network.  She adv she has done that:  Dr. Verne Spurr  [phone]  This is to continue her tx of PCOS

## 2019-11-12 ENCOUNTER — Other Ambulatory Visit: Payer: Self-pay | Admitting: Obstetrics and Gynecology

## 2019-11-12 DIAGNOSIS — E282 Polycystic ovarian syndrome: Secondary | ICD-10-CM

## 2019-11-12 NOTE — Telephone Encounter (Signed)
Referral has been initiated.

## 2020-08-05 ENCOUNTER — Telehealth: Payer: Self-pay

## 2020-08-05 NOTE — Telephone Encounter (Signed)
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  Would like to talk c [facility] about this.  [phone]

## 2020-08-15 NOTE — Telephone Encounter (Signed)
Pt aware.

## 2020-08-15 NOTE — Telephone Encounter (Signed)
She has no medical indication from an OB/GYN standpoint for an exemption, the vaccine does not affect fertilty
